# Patient Record
Sex: Male | Born: 1995 | Race: White | Hispanic: No | Marital: Married | Smoking: Current every day smoker
Health system: Southern US, Community
[De-identification: ages and names within clinical notes are randomized; demographics above are authoritative.]

---
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
## 2021-06-05 ENCOUNTER — Emergency Department (HOSPITAL_COMMUNITY)
Admission: EM | Admit: 2021-06-05 | Discharge: 2021-06-05 | Payer: Self-pay | Attending: Emergency Medicine | Admitting: Emergency Medicine

## 2021-06-05 ENCOUNTER — Emergency Department (HOSPITAL_COMMUNITY): Payer: Self-pay

## 2021-06-05 ENCOUNTER — Other Ambulatory Visit: Payer: Self-pay

## 2021-06-05 DIAGNOSIS — S31825A Open bite of left buttock, initial encounter: Secondary | ICD-10-CM | POA: Insufficient documentation

## 2021-06-05 DIAGNOSIS — R0789 Other chest pain: Secondary | ICD-10-CM | POA: Insufficient documentation

## 2021-06-05 DIAGNOSIS — F1910 Other psychoactive substance abuse, uncomplicated: Secondary | ICD-10-CM

## 2021-06-05 DIAGNOSIS — T424X1A Poisoning by benzodiazepines, accidental (unintentional), initial encounter: Secondary | ICD-10-CM | POA: Insufficient documentation

## 2021-06-05 DIAGNOSIS — T401X1A Poisoning by heroin, accidental (unintentional), initial encounter: Secondary | ICD-10-CM | POA: Insufficient documentation

## 2021-06-05 DIAGNOSIS — W540XXA Bitten by dog, initial encounter: Secondary | ICD-10-CM | POA: Insufficient documentation

## 2021-06-05 LAB — RAPID URINE DRUG SCREEN, HOSP PERFORMED
Amphetamines: POSITIVE — AB
Barbiturates: NOT DETECTED
Benzodiazepines: POSITIVE — AB
Cocaine: POSITIVE — AB
Opiates: NOT DETECTED
Tetrahydrocannabinol: POSITIVE — AB

## 2021-06-05 MED ORDER — TETANUS-DIPHTH-ACELL PERTUSSIS 5-2.5-18.5 LF-MCG/0.5 IM SUSY
0.5000 mL | PREFILLED_SYRINGE | Freq: Once | INTRAMUSCULAR | Status: DC
  Filled 2021-06-05: qty 0.5

## 2021-06-05 MED ORDER — AMOXICILLIN-POT CLAVULANATE 875-125 MG PO TABS
1.0000 | ORAL_TABLET | Freq: Once | ORAL | Status: AC
  Administered 2021-06-05: 1 via ORAL
  Filled 2021-06-05: qty 1

## 2021-06-05 MED ORDER — AMOXICILLIN-POT CLAVULANATE 875-125 MG PO TABS: 1.0000 | ORAL_TABLET | Freq: Two times a day (BID) | ORAL | 0 refills | Status: AC
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
## 2021-06-05 NOTE — Discharge Instructions (Addendum)
Keep wound clean and covered. Wash with warm soapy water. Worrisome signs and symptoms were discussed, and the patient acknowledged understanding to return to clinic if noticed.  ?

## 2021-06-05 NOTE — ED Triage Notes (Addendum)
Pt bib [employer] for ingesting xanax and heroin. Pt also says he got bit in the butt by a large dog. Pt ambulatory to stretcher. A&Ox4, VSS, Denies SI/HI. Pt here for medical clearance for jail.  ?

## 2023-10-29 IMAGING — CR DG CHEST 2V
2 series · 2 of 2 positions shown · non-contrast
Comparison: Chest radiograph dated August 15, 2012

CLINICAL DATA: chest pain. Tackled

EXAM:
CHEST - 2 VIEW

[chest pa]
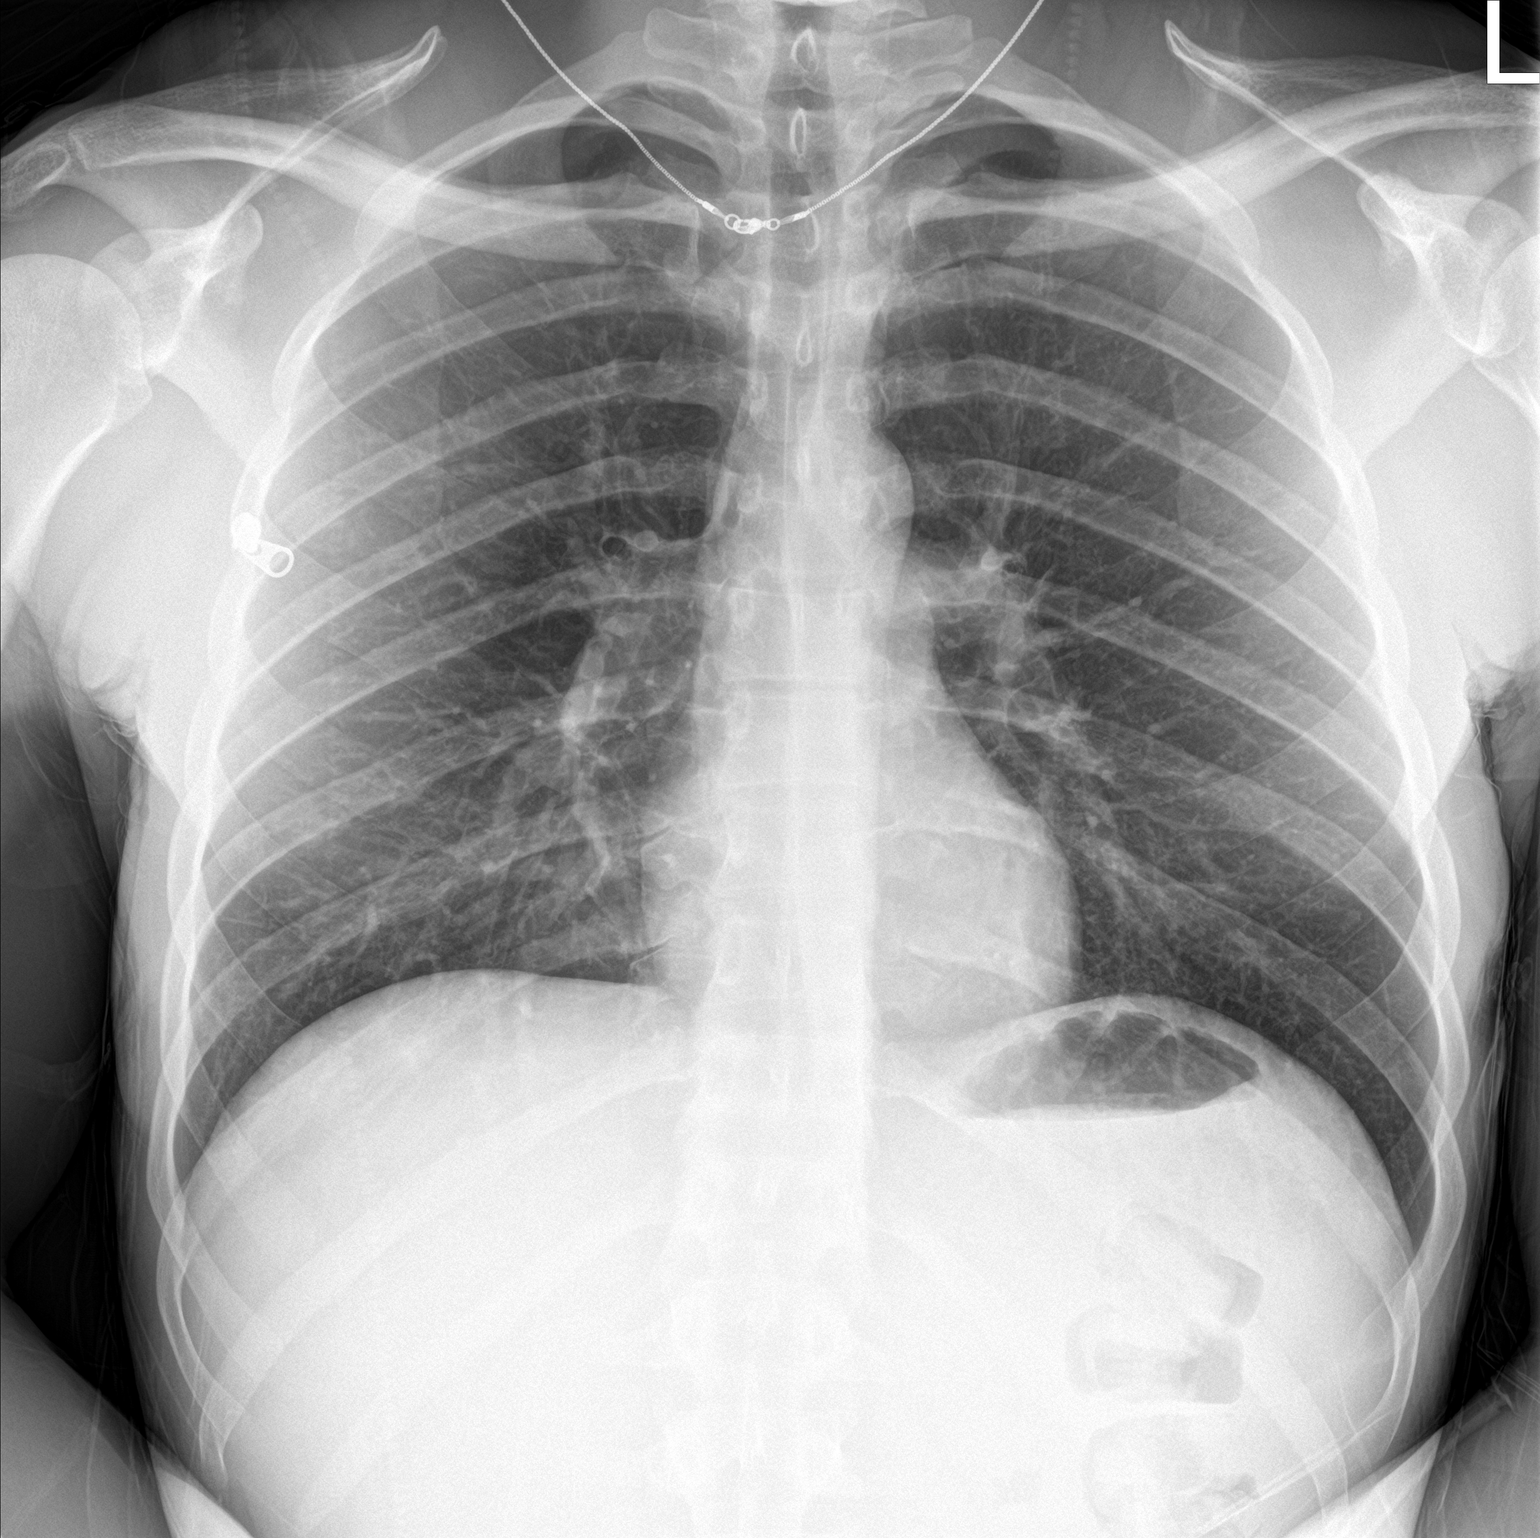

[chest lat]
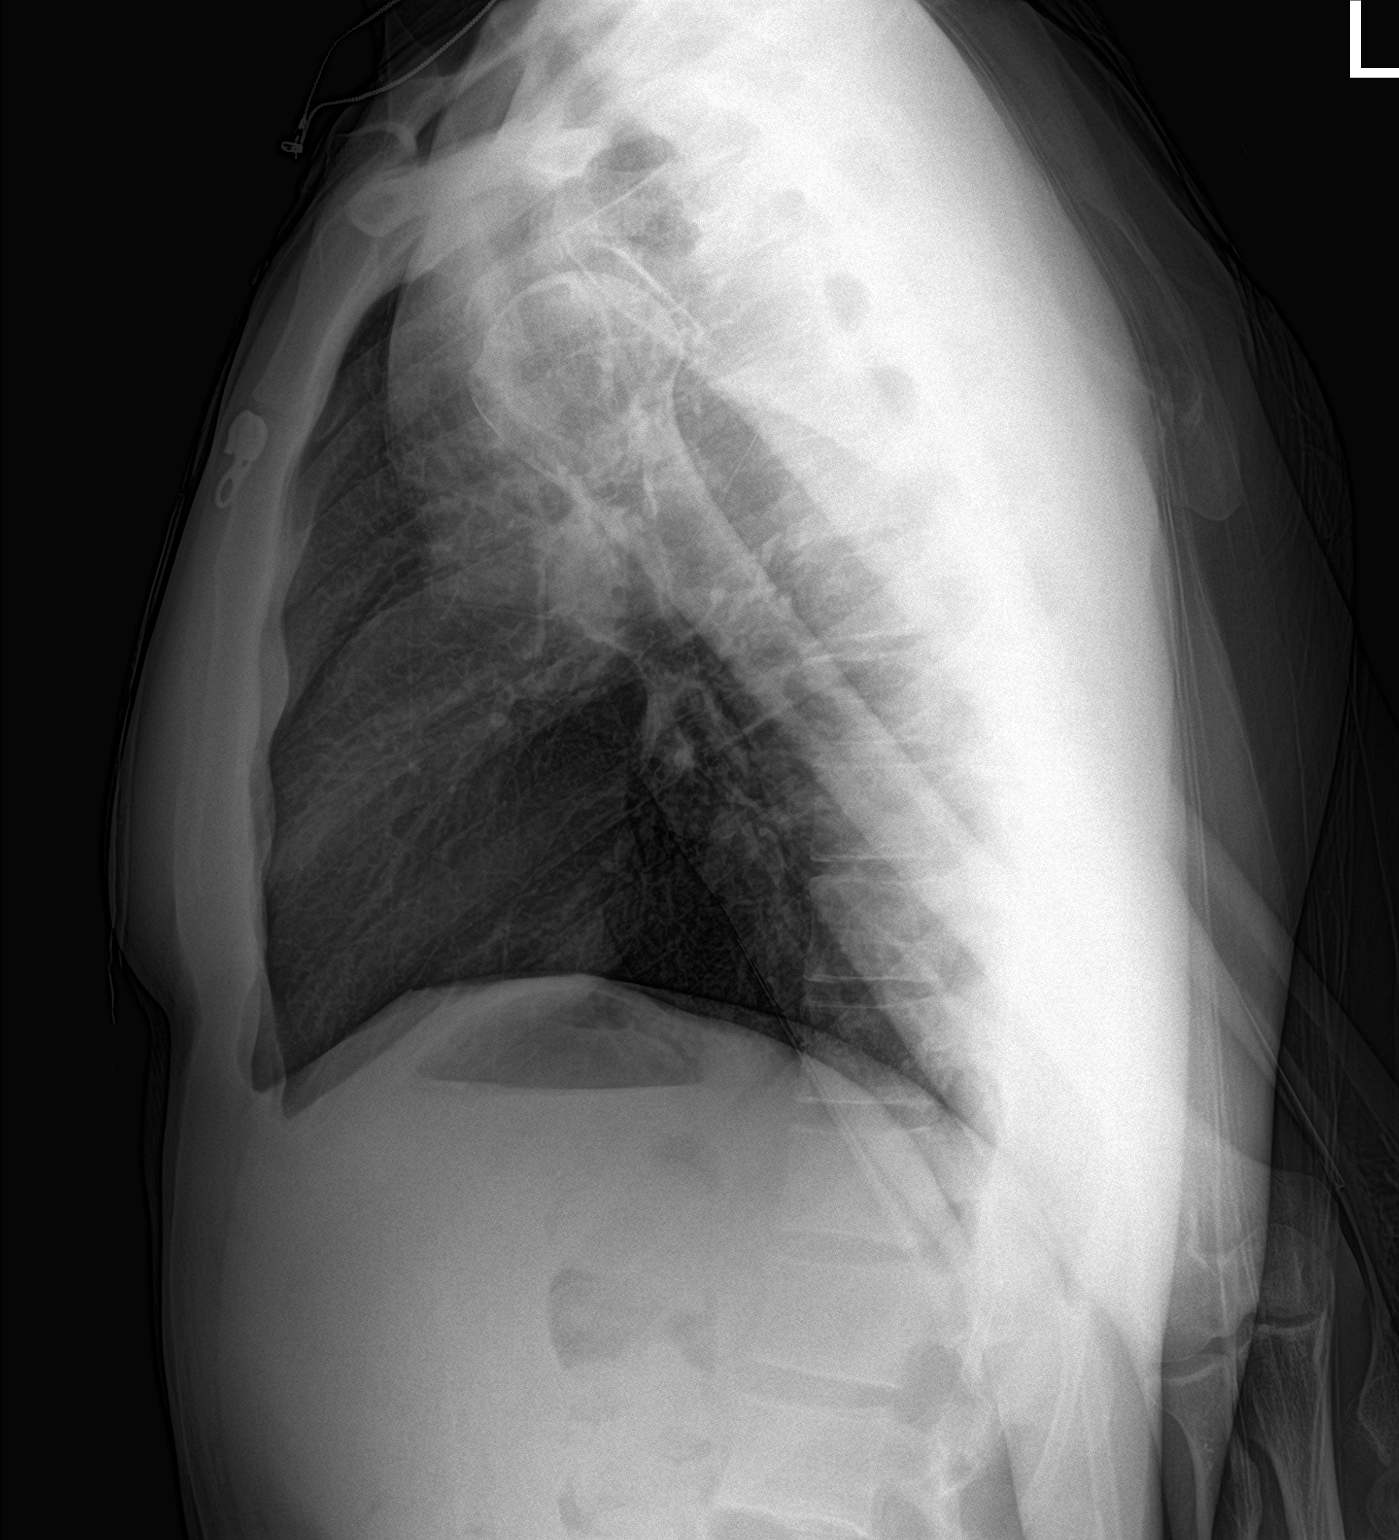

[2 of 2 positions shown; findings below may reference images not displayed]

FINDINGS: The cardiomediastinal silhouette is normal in contour. No pleural
effusion. No pneumothorax. No acute pleuroparenchymal abnormality.
Visualized abdomen is unremarkable. No acute osseous abnormality
noted.
IMPRESSION: No acute cardiopulmonary abnormality.
